# Patient Record
Sex: Male | Born: 1985 | Race: Black or African American | Hispanic: No | Marital: Single | Smoking: Current every day smoker
Health system: Southern US, Community
[De-identification: ages and names within clinical notes are randomized; demographics above are authoritative.]

## PROBLEM LIST (undated history)

## (undated) DIAGNOSIS — D571 Sickle-cell disease without crisis: Secondary | ICD-10-CM

---

## 2014-03-27 DIAGNOSIS — T300 Burn of unspecified body region, unspecified degree: Secondary | ICD-10-CM

## 2014-03-27 DIAGNOSIS — S82142A Displaced bicondylar fracture of left tibia, initial encounter for closed fracture: Secondary | ICD-10-CM

## 2014-03-27 HISTORY — DX: Burn of unspecified body region, unspecified degree: T30.0

## 2014-03-27 HISTORY — DX: Displaced bicondylar fracture of left tibia, initial encounter for closed fracture: S82.142A

## 2014-03-28 DIAGNOSIS — T24201A Burn of second degree of unspecified site of right lower limb, except ankle and foot, initial encounter: Secondary | ICD-10-CM | POA: Insufficient documentation

## 2014-03-28 HISTORY — DX: Burn of second degree of unspecified site of right lower limb, except ankle and foot, initial encounter: T24.201A

## 2019-07-09 ENCOUNTER — Other Ambulatory Visit: Payer: Self-pay

## 2019-07-09 ENCOUNTER — Encounter (HOSPITAL_COMMUNITY): Payer: Self-pay

## 2019-07-09 ENCOUNTER — Emergency Department (HOSPITAL_COMMUNITY)
Admission: EM | Admit: 2019-07-09 | Discharge: 2019-07-09 | Disposition: A | Discharge status: Home / Self Care | Payer: Self-pay | Attending: Emergency Medicine | Admitting: Emergency Medicine

## 2019-07-09 DIAGNOSIS — D5740 Sickle-cell thalassemia without crisis: Secondary | ICD-10-CM | POA: Insufficient documentation

## 2019-07-09 DIAGNOSIS — M25511 Pain in right shoulder: Secondary | ICD-10-CM | POA: Insufficient documentation

## 2019-07-09 HISTORY — DX: Sickle-cell disease without crisis: D57.1

## 2019-07-09 LAB — COMPREHENSIVE METABOLIC PANEL
ALT: 32 U/L (ref 0–44)
AST: 28 U/L (ref 15–41)
Albumin: 4.5 g/dL (ref 3.5–5.0)
Alkaline Phosphatase: 70 U/L (ref 38–126)
Anion gap: 10 (ref 5–15)
BUN: 6 mg/dL (ref 6–20)
CO2: 26 mmol/L (ref 22–32)
Calcium: 9.6 mg/dL (ref 8.9–10.3)
Chloride: 102 mmol/L (ref 98–111)
Creatinine, Ser: 1.18 mg/dL (ref 0.61–1.24)
GFR calc Af Amer: >60 mL/min (ref >60)
GFR calc non Af Amer: >60 mL/min (ref >60)
Glucose, Bld: 137 mg/dL — ABNORMAL HIGH (ref 70–99)
Potassium: 3.6 mmol/L (ref 3.5–5.1)
Sodium: 138 mmol/L (ref 135–145)
Total Bilirubin: 1.3 mg/dL — ABNORMAL HIGH (ref 0.3–1.2)
Total Protein: 7.7 g/dL (ref 6.5–8.1)

## 2019-07-09 LAB — CBC WITH DIFFERENTIAL/PLATELET
Abs Immature Granulocytes: 0.07 10*3/uL (ref 0.00–0.07)
Basophils Absolute: 0 10*3/uL (ref 0.0–0.1)
Basophils Relative: 0 %
Eosinophils Absolute: 0.1 10*3/uL (ref 0.0–0.5)
Eosinophils Relative: 1 %
HCT: 35.8 % — ABNORMAL LOW (ref 39.0–52.0)
Hemoglobin: 11.8 g/dL — ABNORMAL LOW (ref 13.0–17.0)
Immature Granulocytes: 1 %
Lymphocytes Relative: 21 %
Lymphs Abs: 2.2 10*3/uL (ref 0.7–4.0)
MCH: 23.7 pg — ABNORMAL LOW (ref 26.0–34.0)
MCHC: 33 g/dL (ref 30.0–36.0)
MCV: 71.9 fL — ABNORMAL LOW (ref 80.0–100.0)
Monocytes Absolute: 0.7 10*3/uL (ref 0.1–1.0)
Monocytes Relative: 7 %
Neutro Abs: 7.6 10*3/uL (ref 1.7–7.7)
Neutrophils Relative %: 70 %
Platelets: 173 10*3/uL (ref 150–400)
RBC: 4.98 MIL/uL (ref 4.22–5.81)
RDW: 17.6 % — ABNORMAL HIGH (ref 11.5–15.5)
WBC: 10.7 10*3/uL — ABNORMAL HIGH (ref 4.0–10.5)
nRBC: 0 % (ref 0.0–0.2)

## 2019-07-09 LAB — RETICULOCYTES
Immature Retic Fract: 34.7 % — ABNORMAL HIGH (ref 2.3–15.9)
RBC.: 4.98 MIL/uL (ref 4.22–5.81)
Retic Count, Absolute: 114.5 10*3/uL (ref 19.0–186.0)
Retic Ct Pct: 2.3 % (ref 0.4–3.1)

## 2019-07-09 MED ORDER — MORPHINE SULFATE (PF) 4 MG/ML IV SOLN
4.0000 mg | Freq: Once | INTRAVENOUS | Status: AC
  Administered 2019-07-09: 06:00:00 4 mg via INTRAVENOUS
  Filled 2019-07-09: qty 1

## 2019-07-09 MED ORDER — MORPHINE SULFATE (PF) 4 MG/ML IV SOLN
6.0000 mg | Freq: Once | INTRAVENOUS | Status: AC
  Administered 2019-07-09: 6 mg via INTRAVENOUS
  Filled 2019-07-09: qty 2

## 2019-07-09 MED ORDER — KETOROLAC TROMETHAMINE 30 MG/ML IJ SOLN
30.0000 mg | Freq: Once | INTRAMUSCULAR | Status: AC
  Administered 2019-07-09: 03:00:00 30 mg via INTRAVENOUS
  Filled 2019-07-09: qty 1

## 2019-07-09 MED ORDER — DEXTROSE-NACL 5-0.45 % IV SOLN
INTRAVENOUS | Status: DC
  Administered 2019-07-09: 03:00:00 via INTRAVENOUS

## 2019-07-09 NOTE — ED Provider Notes (Signed)
[facility] DEPT Provider Note   CSN: [id number] Arrival date & time: 07/09/19  0055     History   Chief Complaint Chief Complaint  Patient presents with  . Sickle Cell Pain Crisis    HPI Scott Hansen is a 33 y.o. male.     33 year old male with a history of sickle cell anemia, beta thalassemia presents to the emergency department for evaluation of right shoulder pain.  Describes a constant, throbbing pain that started yesterday.  Pain radiates down his right arm.  He has been taking ibuprofen for pain without relief.  Feels that his pain is similar to past episodes of sickle cell crisis.  He is not actively followed by a sickle cell doctor at this time as his previous provider retired.  No history of trauma or associated fevers, extremity numbness or paresthesias, weakness.  The history is provided by the patient. No language interpreter was used.  Sickle Cell Pain Crisis   Past Medical History:  Diagnosis Date  . Sickle cell anemia (HCC)     There are no active problems to display for this patient.   ** The histories are not reviewed yet. Please review them in the "History" navigator section and refresh this [place].     Home Medications    Prior to Admission medications   Not on File    Family History No family history on file.  Social History Social History   Tobacco Use  . Smoking status: Not on file  Substance Use Topics  . Alcohol use: Not on file  . Drug use: Not on file     Allergies   Patient has no known allergies.   Review of Systems Review of Systems Ten systems reviewed and are negative for acute change, except as noted in the HPI.    Physical Exam Updated Vital Signs BP (!) 103/55   Pulse (!) 46   Temp 98.3 F (36.8 C) (Oral)   Resp 18   Ht 6\' 4"  (1.93 m)   Wt 127 kg   SpO2 98%   BMI 34.08 kg/m   Physical Exam Vitals signs and nursing note reviewed.  Constitutional:      General: He is  not in acute distress.    Appearance: He is well-developed. He is not diaphoretic.     Comments: Nontoxic appearing and in NAD  HENT:     Head: Normocephalic and atraumatic.  Eyes:     General: No scleral icterus.    Conjunctiva/sclera: Conjunctivae normal.  Neck:     Musculoskeletal: Normal range of motion.  Cardiovascular:     Rate and Rhythm: Normal rate and regular rhythm.     Pulses: Normal pulses.  Pulmonary:     Effort: Pulmonary effort is normal. No respiratory distress.     Comments: Respirations even and unlabored Musculoskeletal: Normal range of motion.     Comments: Normal ROM of the RUE  Skin:    General: Skin is warm and dry.     Coloration: Skin is not pale.     Findings: No erythema or rash.  Neurological:     General: No focal deficit present.     Mental Status: He is alert and oriented to person, place, and time.     Coordination: Coordination normal.     Comments: Patient moving all extremities spontaneously.  Psychiatric:        Behavior: Behavior normal.      ED Treatments / Results  Labs (all  labs ordered are listed, but only abnormal results are displayed) Labs Reviewed  CBC WITH DIFFERENTIAL/PLATELET - Abnormal; Notable for the following components:      Result Value   WBC 10.7 (*)    Hemoglobin 11.8 (*)    HCT 35.8 (*)    MCV 71.9 (*)    MCH 23.7 (*)    RDW 17.6 (*)    All other components within normal limits  COMPREHENSIVE METABOLIC PANEL - Abnormal; Notable for the following components:   Glucose, Bld 137 (*)    Total Bilirubin 1.3 (*)    All other components within normal limits  RETICULOCYTES - Abnormal; Notable for the following components:   Immature Retic Fract 34.7 (*)    All other components within normal limits    EKG None  Radiology No results found.  Procedures Procedures (including critical care time)  Medications Ordered in ED Medications  dextrose 5 %-0.45 % sodium chloride infusion ( Intravenous New Bag/Given  07/09/19 0235)  ketorolac (TORADOL) 30 MG/ML injection 30 mg (30 mg Intravenous Given 07/09/19 0230)  morphine 4 MG/ML injection 6 mg (6 mg Intravenous Given 07/09/19 0224)  morphine 4 MG/ML injection 6 mg (6 mg Intravenous Given 07/09/19 0402)  morphine 4 MG/ML injection 4 mg (4 mg Intravenous Given 07/09/19 0530)    3:40 AM Patient states pain is improving. Rated at "12/10" on arrival and now down to "10/10". No longer hypertensive to suggest improvement in pain. Labs pending.   Initial Impression / Assessment and Plan / ED Course  I have reviewed the triage vital signs and the nursing notes.  Pertinent labs & imaging results that were available during my care of the patient were reviewed by me and considered in my medical decision making (see chart for details).        33 year old male with a history of sickle thalassemia presents to the emergency department for evaluation of right shoulder pain.  He reports that pain is consistent with his past sickle cell pain and is throbbing, constant since yesterday.  Patient neurovascularly intact with full range of motion of his extremity.  Pain is improved with morphine IV x3.  Labs generally reassuring.  Patient feels comfortable with discharge and outpatient pain control.  I have advised that he follow-up with the sickle cell center to establish care as a patient as he is not currently followed by a primary care doctor.  Return precautions discussed and provided. Patient discharged in stable condition with no unaddressed concerns.   Final Clinical Impressions(s) / ED Diagnoses   Final diagnoses:  Sickle thalassemia disease (HCC)  Acute pain of right shoulder    ED Discharge Orders    None       Antony Madura, PA-C 07/09/19 0554    Palumbo, April, MD 07/09/19 [phone]

## 2019-07-09 NOTE — Discharge Instructions (Signed)
Continue Tylenol or ibuprofen for management of pain.  Follow-up with the sickle cell center to establish care with a sickle cell doctor.  You may return for any new or concerning symptoms.

## 2019-07-09 NOTE — ED Triage Notes (Signed)
Arrived with complaints of sickle cell pain in his right arm that started yesterday. Denies any chest pain or shortness of breath.

## 2019-08-25 ENCOUNTER — Emergency Department (HOSPITAL_COMMUNITY): Payer: No Typology Code available for payment source

## 2019-08-25 ENCOUNTER — Other Ambulatory Visit: Payer: Self-pay

## 2019-08-25 ENCOUNTER — Inpatient Hospital Stay (HOSPITAL_COMMUNITY)
Admission: EM | Admit: 2019-08-25 | Discharge: 2019-08-28 | DRG: 517 | Payer: No Typology Code available for payment source | Attending: Student | Admitting: Student

## 2019-08-25 ENCOUNTER — Encounter (HOSPITAL_COMMUNITY): Payer: Self-pay | Admitting: Emergency Medicine

## 2019-08-25 DIAGNOSIS — S32442A Displaced fracture of posterior column [ilioischial] of left acetabulum, initial encounter for closed fracture: Secondary | ICD-10-CM | POA: Diagnosis present

## 2019-08-25 DIAGNOSIS — S32422A Displaced fracture of posterior wall of left acetabulum, initial encounter for closed fracture: Secondary | ICD-10-CM

## 2019-08-25 DIAGNOSIS — T148XXA Other injury of unspecified body region, initial encounter: Secondary | ICD-10-CM

## 2019-08-25 DIAGNOSIS — Z419 Encounter for procedure for purposes other than remedying health state, unspecified: Secondary | ICD-10-CM

## 2019-08-25 DIAGNOSIS — R52 Pain, unspecified: Secondary | ICD-10-CM

## 2019-08-25 DIAGNOSIS — Y92410 Unspecified street and highway as the place of occurrence of the external cause: Secondary | ICD-10-CM

## 2019-08-25 DIAGNOSIS — Y901 Blood alcohol level of 20-39 mg/100 ml: Secondary | ICD-10-CM | POA: Diagnosis present

## 2019-08-25 DIAGNOSIS — D571 Sickle-cell disease without crisis: Secondary | ICD-10-CM | POA: Diagnosis present

## 2019-08-25 DIAGNOSIS — Z20828 Contact with and (suspected) exposure to other viral communicable diseases: Secondary | ICD-10-CM | POA: Diagnosis present

## 2019-08-25 DIAGNOSIS — E559 Vitamin D deficiency, unspecified: Secondary | ICD-10-CM | POA: Diagnosis present

## 2019-08-25 DIAGNOSIS — S32402A Unspecified fracture of left acetabulum, initial encounter for closed fracture: Secondary | ICD-10-CM

## 2019-08-25 DIAGNOSIS — S42141A Displaced fracture of glenoid cavity of scapula, right shoulder, initial encounter for closed fracture: Secondary | ICD-10-CM | POA: Diagnosis present

## 2019-08-25 HISTORY — DX: Displaced fracture of posterior wall of left acetabulum, initial encounter for closed fracture: S32.422A

## 2019-08-25 LAB — CBC
HCT: 33.8 % — ABNORMAL LOW (ref 39.0–52.0)
HCT: 34 % — ABNORMAL LOW (ref 39.0–52.0)
Hemoglobin: 10.9 g/dL — ABNORMAL LOW (ref 13.0–17.0)
Hemoglobin: 11.2 g/dL — ABNORMAL LOW (ref 13.0–17.0)
MCH: 23.5 pg — ABNORMAL LOW (ref 26.0–34.0)
MCH: 23.8 pg — ABNORMAL LOW (ref 26.0–34.0)
MCHC: 32.2 g/dL (ref 30.0–36.0)
MCHC: 32.9 g/dL (ref 30.0–36.0)
MCV: 73 fL — ABNORMAL LOW (ref 80.0–100.0)
MCV: 72.3 fL — ABNORMAL LOW (ref 80.0–100.0)
Platelets: 150 10*3/uL (ref 150–400)
Platelets: 149 10*3/uL — ABNORMAL LOW (ref 150–400)
RBC: 4.63 MIL/uL (ref 4.22–5.81)
RBC: 4.7 MIL/uL (ref 4.22–5.81)
RDW: 16.5 % — ABNORMAL HIGH (ref 11.5–15.5)
RDW: 16.5 % — ABNORMAL HIGH (ref 11.5–15.5)
WBC: 7.1 10*3/uL (ref 4.0–10.5)
WBC: 10.4 10*3/uL (ref 4.0–10.5)
nRBC: 0 % (ref 0.0–0.2)
nRBC: 0 % (ref 0.0–0.2)

## 2019-08-25 LAB — COMPREHENSIVE METABOLIC PANEL
ALT: 26 U/L (ref 0–44)
AST: 24 U/L (ref 15–41)
Albumin: 4.1 g/dL (ref 3.5–5.0)
Alkaline Phosphatase: 50 U/L (ref 38–126)
Anion gap: 12 (ref 5–15)
BUN: 8 mg/dL (ref 6–20)
CO2: 21 mmol/L — ABNORMAL LOW (ref 22–32)
Calcium: 9.5 mg/dL (ref 8.9–10.3)
Chloride: 105 mmol/L (ref 98–111)
Creatinine, Ser: 1.36 mg/dL — ABNORMAL HIGH (ref 0.61–1.24)
GFR calc Af Amer: >60 mL/min (ref >60)
GFR calc non Af Amer: >60 mL/min (ref >60)
Glucose, Bld: 119 mg/dL — ABNORMAL HIGH (ref 70–99)
Potassium: 3.7 mmol/L (ref 3.5–5.1)
Sodium: 138 mmol/L (ref 135–145)
Total Bilirubin: 0.7 mg/dL (ref 0.3–1.2)
Total Protein: 7.1 g/dL (ref 6.5–8.1)

## 2019-08-25 LAB — SARS CORONAVIRUS 2 (TAT 6-24 HRS): SARS Coronavirus 2: NEGATIVE

## 2019-08-25 LAB — CREATININE, SERUM
Creatinine, Ser: 1.25 mg/dL — ABNORMAL HIGH (ref 0.61–1.24)
GFR calc Af Amer: >60 mL/min (ref >60)
GFR calc non Af Amer: >60 mL/min (ref >60)

## 2019-08-25 LAB — PROTIME-INR
INR: 1.1 (ref 0.8–1.2)
Prothrombin Time: 14.1 seconds (ref 11.4–15.2)

## 2019-08-25 LAB — SURGICAL PCR SCREEN
MRSA, PCR: NEGATIVE
Staphylococcus aureus: NEGATIVE

## 2019-08-25 LAB — TYPE AND SCREEN
ABO/RH(D): O POS
Antibody Screen: NEGATIVE

## 2019-08-25 LAB — ABO/RH: ABO/RH(D): O POS

## 2019-08-25 LAB — HIV ANTIBODY (ROUTINE TESTING W REFLEX): HIV Screen 4th Generation wRfx: NONREACTIVE

## 2019-08-25 LAB — ETHANOL: Alcohol, Ethyl (B): 21 mg/dL — ABNORMAL HIGH (ref <10)

## 2019-08-25 MED ORDER — METHOCARBAMOL 1000 MG/10ML IJ SOLN
500.0000 mg | Freq: Four times a day (QID) | INTRAVENOUS | Status: DC | PRN
  Filled 2019-08-25: qty 5

## 2019-08-25 MED ORDER — SODIUM CHLORIDE 0.9 % IV BOLUS (SEPSIS)
1000.0000 mL | Freq: Once | INTRAVENOUS | Status: AC
  Administered 2019-08-25: 1000 mL via INTRAVENOUS

## 2019-08-25 MED ORDER — IOHEXOL 300 MG/ML  SOLN
100.0000 mL | Freq: Once | INTRAMUSCULAR | Status: AC | PRN
  Administered 2019-08-25: 100 mL via INTRAVENOUS

## 2019-08-25 MED ORDER — TRAMADOL HCL 50 MG PO TABS
50.0000 mg | ORAL_TABLET | Freq: Four times a day (QID) | ORAL | Status: DC
  Administered 2019-08-25 – 2019-08-26 (×5): 50 mg via ORAL
  Filled 2019-08-25 (×5): qty 1

## 2019-08-25 MED ORDER — OXYCODONE HCL 5 MG PO TABS
5.0000 mg | ORAL_TABLET | ORAL | Status: DC | PRN
  Administered 2019-08-25 – 2019-08-26 (×4): 10 mg via ORAL
  Administered 2019-08-27: 5 mg via ORAL
  Administered 2019-08-28: 10 mg via ORAL
  Administered 2019-08-28: 5 mg via ORAL
  Filled 2019-08-25 (×2): qty 2
  Filled 2019-08-25: qty 1
  Filled 2019-08-25 (×3): qty 2
  Filled 2019-08-25: qty 1

## 2019-08-25 MED ORDER — TETANUS-DIPHTH-ACELL PERTUSSIS 5-2.5-18.5 LF-MCG/0.5 IM SUSP
0.5000 mL | Freq: Once | INTRAMUSCULAR | Status: AC
  Administered 2019-08-25: 0.5 mL via INTRAMUSCULAR
  Filled 2019-08-25: qty 0.5

## 2019-08-25 MED ORDER — ONDANSETRON HCL 4 MG/2ML IJ SOLN
4.0000 mg | Freq: Once | INTRAMUSCULAR | Status: AC
  Administered 2019-08-25: 05:00:00 4 mg via INTRAVENOUS
  Filled 2019-08-25: qty 2

## 2019-08-25 MED ORDER — SODIUM CHLORIDE 0.9 % IV SOLN
INTRAVENOUS | Status: DC
  Administered 2019-08-25 – 2019-08-28 (×8): via INTRAVENOUS

## 2019-08-25 MED ORDER — HEPARIN SODIUM (PORCINE) 5000 UNIT/ML IJ SOLN
5000.0000 [IU] | Freq: Three times a day (TID) | INTRAMUSCULAR | Status: DC
  Administered 2019-08-25 (×2): 5000 [IU] via SUBCUTANEOUS
  Filled 2019-08-25 (×2): qty 1

## 2019-08-25 MED ORDER — HYDROMORPHONE HCL 1 MG/ML IJ SOLN
0.5000 mg | INTRAMUSCULAR | Status: DC | PRN
  Administered 2019-08-25 – 2019-08-26 (×3): 0.5 mg via INTRAVENOUS
  Filled 2019-08-25 (×3): qty 1

## 2019-08-25 MED ORDER — METHOCARBAMOL 500 MG PO TABS
500.0000 mg | ORAL_TABLET | Freq: Four times a day (QID) | ORAL | Status: DC | PRN
  Administered 2019-08-25 – 2019-08-26 (×2): 500 mg via ORAL
  Filled 2019-08-25 (×3): qty 1

## 2019-08-25 MED ORDER — LACTATED RINGERS IV SOLN
INTRAVENOUS | Status: AC
  Administered 2019-08-25: 11:00:00 via INTRAVENOUS

## 2019-08-25 MED ORDER — HYDROMORPHONE HCL 1 MG/ML IJ SOLN
0.5000 mg | Freq: Once | INTRAMUSCULAR | Status: AC
  Administered 2019-08-25: 0.5 mg via INTRAVENOUS
  Filled 2019-08-25: qty 1

## 2019-08-25 MED ORDER — FENTANYL CITRATE (PF) 100 MCG/2ML IJ SOLN
50.0000 ug | Freq: Once | INTRAMUSCULAR | Status: AC
  Administered 2019-08-25: 50 ug via INTRAVENOUS
  Filled 2019-08-25: qty 2

## 2019-08-25 MED ORDER — ACETAMINOPHEN 325 MG PO TABS: 325.0000 mg | ORAL_TABLET | Freq: Four times a day (QID) | ORAL | Status: DC | PRN
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
## 2019-08-25 NOTE — Plan of Care (Signed)
°  Problem: Education: °Goal: Knowledge of General Education information will improve °Description: Including pain rating scale, medication(s)/side effects and non-pharmacologic comfort measures °Outcome: Progressing °  °Problem: Pain Managment: °Goal: General experience of comfort will improve °Outcome: Progressing °  °Problem: Safety: °Goal: Ability to remain free from injury will improve °Outcome: Progressing °  °Problem: Activity: °Goal: Risk for activity intolerance will decrease °Outcome: Progressing °  °
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
## 2019-08-25 NOTE — ED Notes (Signed)
Gave pt apple juice, per [facility] - RN.

## 2019-08-25 NOTE — ED Notes (Signed)
Pt to radiology via stretcher.  
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
## 2019-08-25 NOTE — Progress Notes (Signed)
Orthopedic Tech Progress Note Patient Details:  Scott Hansen 26-Apr-1986 [id number] Patient was in a lot of discomfort but I took my time and got it on Ortho Devices Type of Ortho Device: Shoulder immobilizer, Knee Immobilizer Ortho Device/Splint Location: RUE, LLE Ortho Device/Splint Interventions: Application, Adjustment, Ordered   Post Interventions Patient Tolerated: Well, Fair Instructions Provided: Care of device, Adjustment of device   [name] 08/25/2019, 11:18 AM
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
## 2019-08-26 ENCOUNTER — Encounter (HOSPITAL_COMMUNITY): Payer: Self-pay

## 2019-08-26 ENCOUNTER — Inpatient Hospital Stay (HOSPITAL_COMMUNITY): Payer: No Typology Code available for payment source

## 2019-08-26 ENCOUNTER — Inpatient Hospital Stay (HOSPITAL_COMMUNITY): Payer: No Typology Code available for payment source | Admitting: Certified Registered Nurse Anesthetist

## 2019-08-26 ENCOUNTER — Encounter (HOSPITAL_COMMUNITY): Admission: EM | Payer: Self-pay | Source: Home / Self Care | Attending: Student

## 2019-08-26 HISTORY — PX: OPEN REDUCTION INTERNAL FIXATION ACETABULUM POSTERIOR LATERAL: SHX6834

## 2019-08-26 SURGERY — OPEN REDUCTION INTERNAL FIXATION ACETABULUM POSTERIOR LATERAL
Anesthesia: General | Laterality: Left

## 2019-08-26 MED ORDER — FENTANYL CITRATE (PF) 250 MCG/5ML IJ SOLN
INTRAMUSCULAR | Status: AC
  Filled 2019-08-26: qty 5

## 2019-08-26 MED ORDER — ONDANSETRON HCL 4 MG/2ML IJ SOLN
INTRAMUSCULAR | Status: DC | PRN
  Administered 2019-08-26 (×2): 4 mg via INTRAVENOUS

## 2019-08-26 MED ORDER — DEXAMETHASONE SODIUM PHOSPHATE 10 MG/ML IJ SOLN
INTRAMUSCULAR | Status: AC
  Filled 2019-08-26: qty 1

## 2019-08-26 MED ORDER — MIDAZOLAM HCL 2 MG/2ML IJ SOLN
INTRAMUSCULAR | Status: AC
  Filled 2019-08-26: qty 2

## 2019-08-26 MED ORDER — FENTANYL CITRATE (PF) 250 MCG/5ML IJ SOLN
INTRAMUSCULAR | Status: DC | PRN
  Administered 2019-08-26: 100 ug via INTRAVENOUS
  Administered 2019-08-26: 50 ug via INTRAVENOUS
  Administered 2019-08-26: 100 ug via INTRAVENOUS

## 2019-08-26 MED ORDER — ROCURONIUM BROMIDE 10 MG/ML (PF) SYRINGE
PREFILLED_SYRINGE | INTRAVENOUS | Status: AC
  Filled 2019-08-26: qty 10

## 2019-08-26 MED ORDER — LIDOCAINE 2% (20 MG/ML) 5 ML SYRINGE
INTRAMUSCULAR | Status: DC | PRN
  Administered 2019-08-26: 100 mg via INTRAVENOUS

## 2019-08-26 MED ORDER — GABAPENTIN 100 MG PO CAPS
100.0000 mg | ORAL_CAPSULE | Freq: Three times a day (TID) | ORAL | Status: DC
  Administered 2019-08-26 – 2019-08-28 (×5): 100 mg via ORAL
  Filled 2019-08-26 (×5): qty 1

## 2019-08-26 MED ORDER — VANCOMYCIN HCL 1000 MG IV SOLR
INTRAVENOUS | Status: AC
  Filled 2019-08-26: qty 1000

## 2019-08-26 MED ORDER — KETOROLAC TROMETHAMINE 15 MG/ML IJ SOLN
15.0000 mg | Freq: Four times a day (QID) | INTRAMUSCULAR | Status: AC
  Administered 2019-08-26 – 2019-08-27 (×5): 15 mg via INTRAVENOUS
  Filled 2019-08-26 (×5): qty 1

## 2019-08-26 MED ORDER — PROPOFOL 10 MG/ML IV BOLUS
INTRAVENOUS | Status: AC
  Filled 2019-08-26: qty 20

## 2019-08-26 MED ORDER — FENTANYL CITRATE (PF) 100 MCG/2ML IJ SOLN
INTRAMUSCULAR | Status: AC
  Administered 2019-08-26: 25 ug via INTRAVENOUS
  Filled 2019-08-26: qty 2

## 2019-08-26 MED ORDER — ENOXAPARIN SODIUM 40 MG/0.4ML ~~LOC~~ SOLN
40.0000 mg | SUBCUTANEOUS | Status: DC
  Administered 2019-08-27 – 2019-08-28 (×2): 40 mg via SUBCUTANEOUS
  Filled 2019-08-26 (×2): qty 0.4

## 2019-08-26 MED ORDER — PROPOFOL 10 MG/ML IV BOLUS
INTRAVENOUS | Status: DC | PRN
  Administered 2019-08-26: 200 mg via INTRAVENOUS

## 2019-08-26 MED ORDER — MIDAZOLAM HCL 2 MG/2ML IJ SOLN
INTRAMUSCULAR | Status: DC | PRN
  Administered 2019-08-26: 2 mg via INTRAVENOUS

## 2019-08-26 MED ORDER — ACETAMINOPHEN 500 MG PO TABS: 1000.0000 mg | ORAL_TABLET | Freq: Once | ORAL | Status: DC

## 2019-08-26 MED ORDER — CEFAZOLIN SODIUM-DEXTROSE 2-4 GM/100ML-% IV SOLN
INTRAVENOUS | Status: AC
  Filled 2019-08-26: qty 100

## 2019-08-26 MED ORDER — DEXAMETHASONE SODIUM PHOSPHATE 10 MG/ML IJ SOLN
INTRAMUSCULAR | Status: DC | PRN
  Administered 2019-08-26: 10 mg via INTRAVENOUS

## 2019-08-26 MED ORDER — LACTATED RINGERS IV SOLN
INTRAVENOUS | Status: DC
  Administered 2019-08-26: 13:00:00 via INTRAVENOUS

## 2019-08-26 MED ORDER — SUGAMMADEX SODIUM 200 MG/2ML IV SOLN
INTRAVENOUS | Status: DC | PRN
  Administered 2019-08-26: 250 mg via INTRAVENOUS

## 2019-08-26 MED ORDER — ROCURONIUM BROMIDE 10 MG/ML (PF) SYRINGE
PREFILLED_SYRINGE | INTRAVENOUS | Status: DC | PRN
  Administered 2019-08-26: 100 mg via INTRAVENOUS
  Administered 2019-08-26: 40 mg via INTRAVENOUS

## 2019-08-26 MED ORDER — DEXMEDETOMIDINE HCL IN NACL 200 MCG/50ML IV SOLN
INTRAVENOUS | Status: DC | PRN
  Administered 2019-08-26: 8 ug via INTRAVENOUS
  Administered 2019-08-26: 12 ug via INTRAVENOUS
  Administered 2019-08-26 (×3): 8 ug via INTRAVENOUS

## 2019-08-26 MED ORDER — CEFAZOLIN SODIUM-DEXTROSE 2-4 GM/100ML-% IV SOLN
2.0000 g | Freq: Three times a day (TID) | INTRAVENOUS | Status: AC
  Administered 2019-08-26 – 2019-08-27 (×3): 2 g via INTRAVENOUS
  Filled 2019-08-26 (×3): qty 100

## 2019-08-26 MED ORDER — CEFAZOLIN SODIUM-DEXTROSE 2-3 GM-%(50ML) IV SOLR
INTRAVENOUS | Status: DC | PRN
  Administered 2019-08-26: 2 g via INTRAVENOUS

## 2019-08-26 MED ORDER — FENTANYL CITRATE (PF) 100 MCG/2ML IJ SOLN
25.0000 ug | INTRAMUSCULAR | Status: DC | PRN
  Administered 2019-08-26: 18:00:00 25 ug via INTRAVENOUS

## 2019-08-26 MED ORDER — OXYCODONE HCL 5 MG PO TABS: 10.0000 mg | ORAL_TABLET | ORAL | Status: DC | PRN

## 2019-08-26 MED ORDER — HYDROMORPHONE HCL 1 MG/ML IJ SOLN: 1.0000 mg | INTRAMUSCULAR | Status: DC | PRN

## 2019-08-26 MED ORDER — ACETAMINOPHEN 500 MG PO TABS
1000.0000 mg | ORAL_TABLET | Freq: Four times a day (QID) | ORAL | Status: DC
  Administered 2019-08-26 – 2019-08-28 (×7): 1000 mg via ORAL
  Filled 2019-08-26 (×7): qty 2

## 2019-08-26 MED ORDER — TOBRAMYCIN SULFATE 1.2 G IJ SOLR
INTRAMUSCULAR | Status: AC
  Filled 2019-08-26: qty 1.2

## 2019-08-26 SURGICAL SUPPLY — 66 items
BIT DRILL 2.5X300 (BIT) IMPLANT
BIT DRILL STEP 3.5 (DRILL) IMPLANT
BLADE CLIPPER SURG (BLADE) IMPLANT
BRUSH SCRUB EZ PLAIN DRY (MISCELLANEOUS) ×6 IMPLANT
CHLORAPREP W/TINT 26 (MISCELLANEOUS) ×5 IMPLANT
COVER SURGICAL LIGHT HANDLE (MISCELLANEOUS) ×3 IMPLANT
COVER WAND RF STERILE (DRAPES) ×3 IMPLANT
DRAPE C-ARM 42X72 X-RAY (DRAPES) ×3 IMPLANT
DRAPE C-ARMOR (DRAPES) ×3 IMPLANT
DRAPE INCISE IOBAN 66X45 STRL (DRAPES) ×3 IMPLANT
DRAPE INCISE IOBAN 85X60 (DRAPES) ×3 IMPLANT
DRAPE ORTHO SPLIT 77X108 STRL (DRAPES) ×4
DRAPE SURG ORHT 6 SPLT 77X108 (DRAPES) ×2 IMPLANT
DRAPE U-SHAPE 47X51 STRL (DRAPES) ×3 IMPLANT
DRILL BIT 2.5X300 (BIT) ×2
DRILL STEP 3.5 (DRILL)
DRSG MEPILEX BORDER 4X12 (GAUZE/BANDAGES/DRESSINGS) IMPLANT
DRSG MEPILEX BORDER 4X8 (GAUZE/BANDAGES/DRESSINGS) IMPLANT
DRSG MEPILEX SACRM 8.7X9.8 (GAUZE/BANDAGES/DRESSINGS) ×2 IMPLANT
ELECT BLADE 6.5 EXT (BLADE) ×3 IMPLANT
ELECT REM PT RETURN 9FT ADLT (ELECTROSURGICAL) ×3
ELECTRODE REM PT RTRN 9FT ADLT (ELECTROSURGICAL) ×1 IMPLANT
GLOVE BIO SURGEON STRL SZ 6.5 (GLOVE) ×6 IMPLANT
GLOVE BIO SURGEON STRL SZ7.5 (GLOVE) ×12 IMPLANT
GLOVE BIO SURGEONS STRL SZ 6.5 (GLOVE) ×3
GLOVE BIOGEL PI IND STRL 6.5 (GLOVE) ×1 IMPLANT
GLOVE BIOGEL PI IND STRL 7.5 (GLOVE) ×1 IMPLANT
GLOVE BIOGEL PI INDICATOR 6.5 (GLOVE) ×2
GLOVE BIOGEL PI INDICATOR 7.5 (GLOVE) ×2
GOWN STRL REUS W/ TWL LRG LVL3 (GOWN DISPOSABLE) ×2 IMPLANT
GOWN STRL REUS W/TWL LRG LVL3 (GOWN DISPOSABLE) ×4
HANDPIECE INTERPULSE COAX TIP (DISPOSABLE) ×2
KIT BASIN OR (CUSTOM PROCEDURE TRAY) ×3 IMPLANT
KIT TURNOVER KIT B (KITS) ×3 IMPLANT
MANIFOLD NEPTUNE II (INSTRUMENTS) ×3 IMPLANT
NS IRRIG 1000ML POUR BTL (IV SOLUTION) ×3 IMPLANT
PACK TOTAL JOINT (CUSTOM PROCEDURE TRAY) ×3 IMPLANT
PAD ARMBOARD 7.5X6 YLW CONV (MISCELLANEOUS) ×6 IMPLANT
PLATE BONE 91MM 7HOLE PELVIC (Plate) ×2 IMPLANT
RETRIEVER SUT HEWSON (MISCELLANEOUS) ×3 IMPLANT
SCREW CORTEX 3.5 38MM (Screw) ×2 IMPLANT
SCREW CORTEX 3.5 45MM (Screw) ×2 IMPLANT
SCREW CORTEX 3.5X40MM (Screw) ×4 IMPLANT
SCREW LOCK CORT ST 3.5X38 (Screw) IMPLANT
SET HNDPC FAN SPRY TIP SCT (DISPOSABLE) ×1 IMPLANT
SPONGE LAP 18X18 RF (DISPOSABLE) IMPLANT
STAPLER VISISTAT 35W (STAPLE) ×3 IMPLANT
SUCTION FRAZIER HANDLE 10FR (MISCELLANEOUS) ×2
SUCTION TUBE FRAZIER 10FR DISP (MISCELLANEOUS) ×1 IMPLANT
SUT ETHILON 2 0 PSLX (SUTURE) ×6 IMPLANT
SUT FIBERWIRE #2 38 T-5 BLUE (SUTURE) ×6
SUT MNCRL AB 3-0 PS2 18 (SUTURE) ×3 IMPLANT
SUT MON AB 2-0 CT1 36 (SUTURE) ×3 IMPLANT
SUT VIC AB 0 CT1 27 (SUTURE) ×2
SUT VIC AB 0 CT1 27XBRD ANBCTR (SUTURE) ×1 IMPLANT
SUT VIC AB 1 CT1 18XCR BRD 8 (SUTURE) ×1 IMPLANT
SUT VIC AB 1 CT1 27 (SUTURE) ×2
SUT VIC AB 1 CT1 27XBRD ANBCTR (SUTURE) ×1 IMPLANT
SUT VIC AB 1 CT1 8-18 (SUTURE) ×4
SUT VIC AB 2-0 CT1 27 (SUTURE) ×4
SUT VIC AB 2-0 CT1 TAPERPNT 27 (SUTURE) ×1 IMPLANT
SUTURE FIBERWR #2 38 T-5 BLUE (SUTURE) ×2 IMPLANT
TOWEL GREEN STERILE (TOWEL DISPOSABLE) ×6 IMPLANT
TOWEL GREEN STERILE FF (TOWEL DISPOSABLE) ×6 IMPLANT
TRAY FOLEY MTR SLVR 16FR STAT (SET/KITS/TRAYS/PACK) IMPLANT
WATER STERILE IRR 1000ML POUR (IV SOLUTION) IMPLANT

## 2019-08-26 NOTE — Plan of Care (Signed)
  Problem: Education: Goal: Knowledge of General Education information will improve Description: Including pain rating scale, medication(s)/side effects and non-pharmacologic comfort measures Outcome: Progressing   Problem: Pain Managment: Goal: General experience of comfort will improve Outcome: Progressing   Problem: Safety: Goal: Ability to remain free from injury will improve Outcome: Progressing   Problem: Skin Integrity: Goal: Risk for impaired skin integrity will decrease Outcome: Progressing   Problem: Activity: Goal: Risk for activity intolerance will decrease Outcome: Progressing   
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
## 2019-08-26 NOTE — Anesthesia Procedure Notes (Signed)
Procedure Name: Intubation Date/Time: 08/26/2019 2:43 PM Performed by: Kathryne Hitch, CRNA Pre-anesthesia Checklist: Patient identified, Emergency Drugs available, Suction available and Patient being monitored Patient Re-evaluated:Patient Re-evaluated prior to induction Oxygen Delivery Method: Circle system utilized Preoxygenation: Pre-oxygenation with 100% oxygen Induction Type: IV induction Ventilation: Mask ventilation without difficulty Laryngoscope Size: Mac and 4 Grade View: Grade I Tube type: Oral Tube size: 7.5 mm Number of attempts: 1 Airway Equipment and Method: Stylet and Oral airway Placement Confirmation: ETT inserted through vocal cords under direct vision,  positive ETCO2 and breath sounds checked- equal and bilateral Secured at: 23 cm Tube secured with: Tape Dental Injury: Teeth and Oropharynx as per pre-operative assessment
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
## 2019-08-26 NOTE — Plan of Care (Signed)
  Problem: Clinical Measurements: Goal: Ability to maintain clinical measurements within normal limits will improve Outcome: Progressing   Problem: Pain Managment: Goal: General experience of comfort will improve Outcome: Progressing   Problem: Safety: Goal: Ability to remain free from injury will improve Outcome: Progressing   Problem: Skin Integrity: Goal: Risk for impaired skin integrity will decrease Outcome: Progressing   Problem: Education: Goal: Knowledge of General Education information will improve Description: Including pain rating scale, medication(s)/side effects and non-pharmacologic comfort measures Outcome: Progressing   Problem: Clinical Measurements: Goal: Ability to maintain clinical measurements within normal limits will improve Outcome: Progressing   Problem: Activity: Goal: Risk for activity intolerance will decrease Outcome: Progressing   Problem: Nutrition: Goal: Adequate nutrition will be maintained Outcome: Progressing   Problem: Coping: Goal: Level of anxiety will decrease Outcome: Progressing   Problem: Elimination: Goal: Will not experience complications related to bowel motility Outcome: Progressing   Problem: Pain Managment: Goal: General experience of comfort will improve Outcome: Progressing   Problem: Safety: Goal: Ability to remain free from injury will improve Outcome: Progressing   Problem: Skin Integrity: Goal: Risk for impaired skin integrity will decrease Outcome: Progressing

## 2019-08-26 NOTE — Transfer of Care (Signed)
Immediate Anesthesia Transfer of Care Note  Patient: Matheu Royce  Procedure(s) Performed: OPEN REDUCTION INTERNAL FIXATION ACETABULUM POSTERIOR LATERAL (Left )  Patient Location: PACU  Anesthesia Type:General  Level of Consciousness: drowsy, patient cooperative and responds to stimulation  Airway & Oxygen Therapy: Patient Spontanous Breathing and Patient connected to face mask oxygen  Post-op Assessment: Report given to RN and Post -op Vital signs reviewed and stable  Post vital signs: Reviewed and stable  Last Vitals:  Vitals Value Taken Time  BP 134/81 08/26/19 1745  Temp 36.7 C 08/26/19 1745  Pulse 87 08/26/19 1750  Resp 28 08/26/19 1750  SpO2 100 % 08/26/19 1750  Vitals shown include unvalidated device data.  Last Pain:  Vitals:   08/26/19 1745  TempSrc:   PainSc: Asleep      Patients Stated Pain Goal: 3 (07/37/10 6269)  Complications: No apparent anesthesia complications

## 2019-08-27 ENCOUNTER — Encounter (HOSPITAL_COMMUNITY): Payer: Self-pay | Admitting: Student

## 2019-08-27 DIAGNOSIS — S42151A Displaced fracture of neck of scapula, right shoulder, initial encounter for closed fracture: Secondary | ICD-10-CM

## 2019-08-27 DIAGNOSIS — S42141A Displaced fracture of glenoid cavity of scapula, right shoulder, initial encounter for closed fracture: Secondary | ICD-10-CM

## 2019-08-27 HISTORY — DX: Displaced fracture of glenoid cavity of scapula, right shoulder, initial encounter for closed fracture: S42.151A

## 2019-08-27 HISTORY — DX: Displaced fracture of glenoid cavity of scapula, right shoulder, initial encounter for closed fracture: S42.141A

## 2019-08-27 LAB — CBC
HCT: 30.7 % — ABNORMAL LOW (ref 39.0–52.0)
Hemoglobin: 10.4 g/dL — ABNORMAL LOW (ref 13.0–17.0)
MCH: 23.8 pg — ABNORMAL LOW (ref 26.0–34.0)
MCHC: 33.9 g/dL (ref 30.0–36.0)
MCV: 70.3 fL — ABNORMAL LOW (ref 80.0–100.0)
Platelets: 164 10*3/uL (ref 150–400)
RBC: 4.37 MIL/uL (ref 4.22–5.81)
RDW: 16.1 % — ABNORMAL HIGH (ref 11.5–15.5)
WBC: 12.5 10*3/uL — ABNORMAL HIGH (ref 4.0–10.5)
nRBC: 0 % (ref 0.0–0.2)

## 2019-08-27 LAB — VITAMIN D 25 HYDROXY (VIT D DEFICIENCY, FRACTURES): Vit D, 25-Hydroxy: 5.14 ng/mL — ABNORMAL LOW (ref 30–100)

## 2019-08-27 MED ORDER — VITAMIN D 25 MCG (1000 UNIT) PO TABS
2000.0000 [IU] | ORAL_TABLET | Freq: Two times a day (BID) | ORAL | Status: DC
  Administered 2019-08-27 – 2019-08-28 (×3): 2000 [IU] via ORAL
  Filled 2019-08-27 (×3): qty 2

## 2019-08-27 MED ORDER — VITAMIN D (ERGOCALCIFEROL) 1.25 MG (50000 UNIT) PO CAPS
50000.0000 [IU] | ORAL_CAPSULE | ORAL | Status: DC
  Administered 2019-08-27: 50000 [IU] via ORAL
  Filled 2019-08-27: qty 1

## 2019-08-27 NOTE — Plan of Care (Signed)
  Problem: Education: Goal: Knowledge of General Education information will improve Description: Including pain rating scale, medication(s)/side effects and non-pharmacologic comfort measures Outcome: Progressing   Problem: Health Behavior/Discharge Planning: Goal: Ability to manage health-related needs will improve Outcome: Progressing   Problem: Clinical Measurements: Goal: Ability to maintain clinical measurements within normal limits will improve Outcome: Progressing   Problem: Pain Managment: Goal: General experience of comfort will improve Outcome: Progressing   Problem: Safety: Goal: Ability to remain free from injury will improve Outcome: Progressing   Problem: Skin Integrity: Goal: Risk for impaired skin integrity will decrease Outcome: Progressing   Problem: Education: Goal: Knowledge of General Education information will improve Description: Including pain rating scale, medication(s)/side effects and non-pharmacologic comfort measures Outcome: Progressing   Problem: Clinical Measurements: Goal: Ability to maintain clinical measurements within normal limits will improve Outcome: Progressing   Problem: Activity: Goal: Risk for activity intolerance will decrease Outcome: Progressing   Problem: Nutrition: Goal: Adequate nutrition will be maintained Outcome: Progressing   Problem: Elimination: Goal: Will not experience complications related to bowel motility Outcome: Progressing   Problem: Pain Managment: Goal: General experience of comfort will improve Outcome: Progressing   Problem: Safety: Goal: Ability to remain free from injury will improve Outcome: Progressing   Problem: Skin Integrity: Goal: Risk for impaired skin integrity will decrease Outcome: Progressing

## 2019-08-27 NOTE — Anesthesia Postprocedure Evaluation (Signed)
Anesthesia Post Note  Patient: Banker  Procedure(s) Performed: OPEN REDUCTION INTERNAL FIXATION ACETABULUM POSTERIOR LATERAL (Left )     Patient location during evaluation: PACU Anesthesia Type: General Level of consciousness: sedated and patient cooperative Pain management: pain level controlled Vital Signs Assessment: post-procedure vital signs reviewed and stable Respiratory status: spontaneous breathing Cardiovascular status: stable Anesthetic complications: no    Last Vitals:  Vitals:   08/27/19 0408 08/27/19 0749  BP: 134/82 132/75  Pulse: (!) 53 69  Resp: 17 18  Temp: 36.8 C 36.5 C  SpO2: 100% 100%    Last Pain:  Vitals:   08/27/19 0749  TempSrc: Oral  PainSc:                  Nolon Nations
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
## 2019-08-28 ENCOUNTER — Encounter (HOSPITAL_COMMUNITY): Payer: Self-pay

## 2019-08-28 ENCOUNTER — Inpatient Hospital Stay (HOSPITAL_COMMUNITY): Payer: No Typology Code available for payment source

## 2019-08-28 MED ORDER — IOHEXOL 180 MG/ML  SOLN
10.0000 mL | Freq: Once | INTRAMUSCULAR | Status: AC | PRN
  Administered 2019-08-28: 10 mL via INTRA_ARTICULAR

## 2019-08-28 MED ORDER — GABAPENTIN 100 MG PO CAPS: 100.0000 mg | ORAL_CAPSULE | Freq: Three times a day (TID) | ORAL | 0 refills | Status: DC

## 2019-08-28 MED ORDER — SODIUM CHLORIDE (PF) 0.9 % IJ SOLN
10.0000 mL | Freq: Once | INTRAMUSCULAR | Status: AC
  Administered 2019-08-28: 10 mL
  Filled 2019-08-28: qty 10

## 2019-08-28 MED ORDER — VITAMIN D (ERGOCALCIFEROL) 1.25 MG (50000 UNIT) PO CAPS: 50000.0000 [IU] | ORAL_CAPSULE | ORAL | 0 refills | Status: AC

## 2019-08-28 MED ORDER — VITAMIN D-3 125 MCG (5000 UT) PO TABS: 5000.0000 [IU] | ORAL_TABLET | Freq: Every day | ORAL | 1 refills | Status: AC

## 2019-08-28 MED ORDER — ENOXAPARIN SODIUM 40 MG/0.4ML ~~LOC~~ SOLN: 40.0000 mg | SUBCUTANEOUS | 0 refills | Status: DC

## 2019-08-28 MED ORDER — OXYCODONE-ACETAMINOPHEN 5-325 MG PO TABS: 1.0000 | ORAL_TABLET | ORAL | 0 refills | Status: AC | PRN

## 2019-08-28 MED ORDER — METHOCARBAMOL 750 MG PO TABS: 750.0000 mg | ORAL_TABLET | Freq: Four times a day (QID) | ORAL | 0 refills | Status: DC | PRN

## 2019-08-28 MED ORDER — GADOBENATE DIMEGLUMINE 529 MG/ML IV SOLN
0.0100 mL | Freq: Once | INTRAVENOUS | Status: AC | PRN
  Administered 2019-08-28: 0.01 mL via INTRA_ARTICULAR

## 2019-08-28 MED ORDER — LIDOCAINE HCL (PF) 1 % IJ SOLN
5.0000 mL | Freq: Once | INTRAMUSCULAR | Status: AC
  Administered 2019-08-28: 5 mL via INTRADERMAL
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
## 2019-08-28 NOTE — Plan of Care (Signed)

## 2019-08-28 NOTE — Progress Notes (Signed)
Patient discharging via [facility]AR accompanied by [employer]. Per Case management,  they wont be able to arrange DME since patient is going to jail. Discharge paperwork was given to EMTS, and also explained to patient prior to discharge with patient verbalizing understanding. No further questions to concerns voiced.
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
## 2019-08-28 NOTE — Discharge Instructions (Signed)
[facility] Discharge Instructions   General Discharge Instructions  WEIGHT BEARING STATUS: Touchdown weightbearing left lower extremity. Okay to weightbear as tolerated through right shoulder  RANGE OF MOTION/ACTIVITY: Okay for gentle range of motion of hip.  Wound Care: Incisions can be left open to air if there is no drainage. Leave steri-strips in place. If incision continues to have drainage, follow wound care instructions below. Okay to shower if no drainage from incisions.  DVT/PE prophylaxis: Lovenox x 30 days  Diet: as you were eating previously.  Can use over the counter stool softeners and bowel preparations, such as Miralax, to help with bowel movements.  Narcotics can be constipating.  Be sure to drink plenty of fluids  PAIN MEDICATION USE AND EXPECTATIONS  You have likely been given narcotic medications to help control your pain.  After a traumatic event that results in an fracture (broken bone) with or without surgery, it is ok to use narcotic pain medications to help control one's pain.  We understand that everyone responds to pain differently and each individual patient will be evaluated on a regular basis for the continued need for narcotic medications. Ideally, narcotic medication use should last no more than 6-8 weeks (coinciding with fracture healing).   As a patient it is your responsibility as well to monitor narcotic medication use and report the amount and frequency you use these medications when you come to your office visit.   We would also advise that if you are using narcotic medications, you should take a dose prior to therapy to maximize you participation.  IF YOU ARE ON NARCOTIC MEDICATIONS IT IS NOT PERMISSIBLE TO OPERATE A MOTOR VEHICLE (MOTORCYCLE/CAR/TRUCK/MOPED) OR HEAVY MACHINERY DO NOT MIX NARCOTICS WITH OTHER CNS (CENTRAL NERVOUS SYSTEM) DEPRESSANTS SUCH AS ALCOHOL   STOP SMOKING OR USING NICOTINE PRODUCTS!!!!  As discussed nicotine  severely impairs your body's ability to heal surgical and traumatic wounds but also impairs bone healing.  Wounds and bone heal by forming microscopic blood vessels (angiogenesis) and nicotine is a vasoconstrictor (essentially, shrinks blood vessels).  Therefore, if vasoconstriction occurs to these microscopic blood vessels they essentially disappear and are unable to deliver necessary nutrients to the healing tissue.  This is one modifiable factor that you can do to dramatically increase your chances of healing your injury.    (This means no smoking, no nicotine gum, patches, etc)  DO NOT USE NONSTEROIDAL ANTI-INFLAMMATORY DRUGS (NSAID'S)  Using products such as Advil (ibuprofen), Aleve (naproxen), Motrin (ibuprofen) for additional pain control during fracture healing can delay and/or prevent the healing response.  If you would like to take over the counter (OTC) medication, Tylenol (acetaminophen) is ok.  However, some narcotic medications that are given for pain control contain acetaminophen as well. Therefore, you should not exceed more than 4000 mg of tylenol in a day if you do not have liver disease.  Also note that there are may OTC medicines, such as cold medicines and allergy medicines that my contain tylenol as well.  If you have any questions about medications and/or interactions please ask your doctor/PA or your pharmacist.      ICE AND ELEVATE INJURED/OPERATIVE EXTREMITY  Using ice and elevating the injured extremity above your heart can help with swelling and pain control.  Icing in a pulsatile fashion, such as 20 minutes on and 20 minutes off, can be followed.    Do not place ice directly on skin. Make sure there is a barrier between to skin and  the ice pack.    Using frozen items such as frozen peas works well as the conform nicely to the are that needs to be iced.  USE AN ACE WRAP OR TED HOSE FOR SWELLING CONTROL  In addition to icing and elevation, Ace wraps or TED hose are used to  help limit and resolve swelling.  It is recommended to use Ace wraps or TED hose until you are informed to stop.    When using Ace Wraps start the wrapping distally (farthest away from the body) and wrap proximally (closer to the body)   Example: If you had surgery on your leg or thing and you do not have a splint on, start the ace wrap at the toes and work your way up to the thigh        If you had surgery on your upper extremity and do not have a splint on, start the ace wrap at your fingers and work your way up to the upper arm  IF YOU ARE IN A SPLINT OR CAST DO NOT [place]   If your splint gets wet for any reason please contact the office immediately. You may shower in your splint or cast as long as you keep it dry.  This can be done by wrapping in a cast cover or garbage back (or similar)  Do Not stick any thing down your splint or cast such as pencils, money, or hangers to try and scratch yourself with.  If you feel itchy take benadryl as prescribed on the bottle for itching  IF YOU ARE IN A CAM BOOT (BLACK BOOT)  You may remove boot periodically. Perform daily dressing changes as noted below.  Wash the liner of the boot regularly and wear a sock when wearing the boot. It is recommended that you sleep in the boot until told otherwise   CALL THE OFFICE WITH ANY QUESTIONS OR CONCERNS: [phone]   VISIT OUR WEBSITE FOR ADDITIONAL INFORMATION: [email or web address]    Discharge Wound Care Instructions  Do NOT apply any ointments, solutions or lotions to pin sites or surgical wounds.  These prevent needed drainage and even though solutions like hydrogen peroxide kill bacteria, they also damage cells lining the pin sites that help fight infection.  Applying lotions or ointments can keep the wounds moist and can cause them to breakdown and open up as well. This can increase the risk for infection. When in doubt call the office.  Surgical incisions should be dressed daily.  If  any drainage is noted, use one layer of adaptic, then gauze, Kerlix, and an ace wrap.  Once the incision is completely dry and without drainage, it may be left open to air out.  Showering may begin 36-48 hours later.  Cleaning gently with soap and water.  Traumatic wounds should be dressed daily as well.    One layer of adaptic, gauze, Kerlix, then ace wrap.  The adaptic can be discontinued once the draining has ceased    If you have a wet to dry dressing: wet the gauze with saline the squeeze as much saline out so the gauze is moist (not soaking wet), place moistened gauze over wound, then place a dry gauze over the moist one, followed by Kerlix wrap, then ace wrap.

## 2019-09-04 ENCOUNTER — Encounter (HOSPITAL_COMMUNITY): Payer: Self-pay | Admitting: Student

## 2019-09-25 ENCOUNTER — Ambulatory Visit: Payer: Self-pay | Admitting: Orthopedic Surgery
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
## 2020-03-25 DIAGNOSIS — Z0000 Encounter for general adult medical examination without abnormal findings: Secondary | ICD-10-CM | POA: Insufficient documentation

## 2020-03-25 DIAGNOSIS — D571 Sickle-cell disease without crisis: Secondary | ICD-10-CM | POA: Insufficient documentation

## 2020-03-25 HISTORY — DX: Encounter for general adult medical examination without abnormal findings: Z00.00

## 2020-03-25 HISTORY — DX: Sickle-cell disease without crisis: D57.1

## 2021-01-11 DIAGNOSIS — R079 Chest pain, unspecified: Secondary | ICD-10-CM

## 2021-01-11 DIAGNOSIS — R778 Other specified abnormalities of plasma proteins: Secondary | ICD-10-CM

## 2021-01-11 DIAGNOSIS — R52 Pain, unspecified: Secondary | ICD-10-CM

## 2021-01-11 DIAGNOSIS — D5700 Hb-SS disease with crisis, unspecified: Secondary | ICD-10-CM

## 2021-01-11 DIAGNOSIS — R7989 Other specified abnormal findings of blood chemistry: Secondary | ICD-10-CM

## 2021-01-11 HISTORY — DX: Chest pain, unspecified: R07.9

## 2021-01-11 HISTORY — DX: Other specified abnormal findings of blood chemistry: R79.89

## 2021-01-11 HISTORY — DX: Pain, unspecified: R52

## 2021-01-11 HISTORY — DX: Other specified abnormalities of plasma proteins: R77.8

## 2021-01-11 HISTORY — DX: Hb-SS disease with crisis, unspecified: D57.00

## 2021-01-29 ENCOUNTER — Telehealth: Payer: Self-pay

## 2021-01-29 DIAGNOSIS — D571 Sickle-cell disease without crisis: Secondary | ICD-10-CM | POA: Insufficient documentation

## 2021-01-29 NOTE — Telephone Encounter (Signed)
Notes on file.  

## 2021-02-01 ENCOUNTER — Ambulatory Visit: Payer: Medicaid Other | Admitting: Cardiology

## 2021-02-01 ENCOUNTER — Telehealth: Payer: Self-pay

## 2021-02-01 NOTE — Telephone Encounter (Signed)
Notes faxed to [facility]

## 2021-02-17 ENCOUNTER — Ambulatory Visit (INDEPENDENT_AMBULATORY_CARE_PROVIDER_SITE_OTHER): Payer: Medicaid Other | Admitting: Cardiology

## 2021-02-17 ENCOUNTER — Other Ambulatory Visit: Payer: Self-pay

## 2021-02-17 ENCOUNTER — Encounter: Payer: Self-pay | Admitting: Cardiology

## 2021-02-17 VITALS — BP 120/90 | HR 88 | Ht 75.0 in | Wt 301.1 lb

## 2021-02-17 DIAGNOSIS — R0602 Shortness of breath: Secondary | ICD-10-CM

## 2021-02-17 DIAGNOSIS — Z7689 Persons encountering health services in other specified circumstances: Secondary | ICD-10-CM | POA: Diagnosis not present

## 2021-02-17 DIAGNOSIS — R9431 Abnormal electrocardiogram [ECG] [EKG]: Secondary | ICD-10-CM

## 2021-02-17 DIAGNOSIS — R079 Chest pain, unspecified: Secondary | ICD-10-CM | POA: Diagnosis not present
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
## 2021-02-17 NOTE — Patient Instructions (Signed)
Medication Instructions:  Your physician recommends that you continue on your current medications as directed. Please refer to the Current Medication list given to you today.  *If you need a refill on your cardiac medications before your next appointment, please call your pharmacy*   Lab Work: None If you have labs (blood work) drawn today and your tests are completely normal, you will receive your results only by: . MyChart Message (if you have MyChart) OR . A paper copy in the mail If you have any lab test that is abnormal or we need to change your treatment, we will call you to review the results.   Testing/Procedures: Your physician has requested that you have a stress echocardiogram. For further information please visit [email or web address]. Please follow instruction sheet as given.    Follow-Up: At [facility], you and your health needs are our priority.  As part of our continuing mission to provide you with exceptional heart care, we have created designated Provider Care Teams.  These Care Teams include your primary Cardiologist (physician) and Advanced Practice Providers (APPs -  Physician Assistants and Nurse Practitioners) who all work together to provide you with the care you need, when you need it.  We recommend signing up for the patient portal called "MyChart".  Sign up information is provided on this After Visit Summary.  MyChart is used to connect with patients for Virtual Visits (Telemedicine).  Patients are able to view lab/test results, encounter notes, upcoming appointments, etc.  Non-urgent messages can be sent to your provider as well.   To learn more about what you can do with MyChart, go to [email or web address].    Your next appointment:   3 month(s)  The format for your next appointment:   In Person  Provider:   Kardie Tobb, DO   Other Instructions     Stress Echocardiogram Information Sheet                                                       Instructions:    1. You may take your morning medications the morning of the test  2. Light breakfast no caffeine  3. Dress prepared to exercise.  4. DO NOT use ANY caffeine or tobacco products 3 hours before appointment.  5. Please bring all current prescription medications.  Exercise Stress Echocardiogram An exercise stress echocardiogram is a test to check how well your heart is working. This test uses sound waves and a computer to make pictures of your heart. These pictures will be taken before and after you exercise. For this test, you will walk on a treadmill or ride a bicycle to make your heart beat faster. While you exercise, your heart will be checked with an electrocardiogram (ECG). Your blood pressure will also be checked. You may have this test if:  You have chest pain or a heart problem.  You had a heart attack or heart surgery not long ago.  You have heart valve problems.  You have a condition that causes narrowing of the blood vessels that supply your heart.  You have a high risk of heart disease and: ? You are starting a new exercise program. ? You need to have a big surgery. Tell a doctor about:  Any allergies you have.  All medicines you are taking. This includes vitamins, herbs, eye   drops, creams, and over-the-counter medicines.  Any problems you or family members have had with medicines that make you fall asleep (anesthetic medicines).  Any surgeries you have had.  Any blood disorders you have.  Any medical conditions you have.  Whether you are pregnant or may be pregnant. What are the risks? Generally, this is a safe test. However, problems may occur, including:  Chest pain.  Feeling dizzy or light-headed.  Shortness of breath.  Increased or irregular heartbeat.  Feeling like you may vomit (nausea) or vomiting.  Heart attack. This is very rare. What happens before the test? Medicines  Ask your doctor about changing or stopping your  normal medicines. This is important if you take diabetes medicines or blood thinners.  If you use an inhaler, bring it to the test. General instructions  Wear comfortable clothes and walking shoes.  Follow instructions from your doctor about what you cannot eat or drink before the test.  Do not drink or eat anything that has caffeine in it. Stop having caffeine 24 hours before the test.  Do not smoke or use products that contain nicotine or tobacco for 4 hours before the test. If you need help quitting, ask your doctor. What happens during the test?  You will take off your clothes from the waist up and put on a hospital gown.  Electrodes or patches will be put on your chest.  A blood pressure cuff will be put on your arm.  Before you exercise, a computer will make a picture of your heart. To do this: ? You will lie down and a gel will be put on your chest. ? A wand will be moved over the gel. ? Sound waves from the wand will go to the computer to make the picture.  Then, you will start to exercise. You may walk on a treadmill or pedal a bicycle.  Your blood pressure and heart rhythm will be checked while you exercise.  The exercise will get harder or faster.  You will exercise until: ? Your heart reaches a certain level. ? You are too tired to go on. ? You cannot go on because of chest pain, weakness, or dizziness.  You will lie down right away so another picture of your heart can be taken. The procedure may vary among doctors and hospitals.   What can I expect after the test?  After your test, it is common to have: ? Mild soreness. ? Mild tiredness. Your heart rate and blood pressure will be checked until they return to your normal levels. You should not have any new symptoms after this test. Follow these instructions at home:  If your doctor says that you can, you may: ? Eat what you normally eat. ? Do your normal activities.  Take over-the-counter and prescription  medicines only as told by your doctor.  Keep all follow-up visits.  It is up to you to get the results of your test. Ask how to get your results when they are ready. Contact a doctor if:  You feel dizzy or light-headed.  You have a fast or irregular heartbeat.  You feel like you may vomit or you vomit.  You have a headache.  You feel short of breath. Get help right away if:  You develop pain or pressure: ? In your chest. ? In your jaw or neck. ? Between your shoulders. ? That goes down your left arm.  You faint.  You have trouble breathing. These symptoms may be   an emergency. Get medical help right away. Call your local emergency services (911 in the [place]  Do not wait to see if the symptoms will go away.  Do not drive yourself to the hospital. Summary  This is a test that checks how well your heart is working.  Follow instructions about what you cannot eat or drink before the test. Ask your doctor if you should take your normal medicines before the test.  Stop having caffeine 24 hours before the test.  Do not smoke or use products with nicotine or tobacco in them for 4 hours before the test.  During the test, your blood pressure and heart rhythm will be checked while you exercise. This information is not intended to replace advice given to you by your health care provider. Make sure you discuss any questions you have with your health care provider. Document Revised: 05/12/2020 Document Reviewed: 05/12/2020 Elsevier Patient Education  2021 [employer]   

## 2021-03-10 DIAGNOSIS — R079 Chest pain, unspecified: Secondary | ICD-10-CM

## 2021-03-24 ENCOUNTER — Telehealth: Payer: Self-pay

## 2021-03-24 NOTE — Telephone Encounter (Signed)
Left message for patient to return my call.

## 2021-03-24 NOTE — Telephone Encounter (Signed)
Spoke with patient's fiancee about patient's stress echo results. She verbalizes understanding. No further questions or concerns at this time.

## 2021-03-24 NOTE — Telephone Encounter (Signed)
Spoke with patient's fiancee, see chart.

## 2021-03-24 NOTE — Telephone Encounter (Signed)
   [facility] Clock and pt calling back, they said pt is incarcerated and provided authorization to [facility] to get pt's result, please call back [facility] cell # [phone]

## 2021-05-21 ENCOUNTER — Ambulatory Visit: Payer: Medicaid Other | Admitting: Cardiology

## 2021-05-24 ENCOUNTER — Encounter: Payer: Self-pay | Admitting: Cardiology

## 2022-02-14 ENCOUNTER — Other Ambulatory Visit: Payer: Self-pay | Admitting: Cardiology

## 2022-02-14 DIAGNOSIS — R079 Chest pain, unspecified: Secondary | ICD-10-CM

## 2023-10-07 ENCOUNTER — Emergency Department (HOSPITAL_COMMUNITY)
Admission: EM | Admit: 2023-10-07 | Discharge: 2023-10-07 | Discharge status: Against Medical Advice | Payer: Medicaid Other | Attending: Emergency Medicine | Admitting: Emergency Medicine

## 2023-10-07 ENCOUNTER — Other Ambulatory Visit: Payer: Self-pay

## 2023-10-07 ENCOUNTER — Encounter (HOSPITAL_COMMUNITY): Payer: Self-pay | Admitting: *Deleted

## 2023-10-07 ENCOUNTER — Emergency Department (HOSPITAL_COMMUNITY): Payer: Medicaid Other

## 2023-10-07 DIAGNOSIS — R509 Fever, unspecified: Secondary | ICD-10-CM | POA: Diagnosis present

## 2023-10-07 DIAGNOSIS — J101 Influenza due to other identified influenza virus with other respiratory manifestations: Secondary | ICD-10-CM | POA: Insufficient documentation

## 2023-10-07 DIAGNOSIS — Z20822 Contact with and (suspected) exposure to covid-19: Secondary | ICD-10-CM | POA: Insufficient documentation

## 2023-10-07 LAB — COMPREHENSIVE METABOLIC PANEL
ALT: 30 U/L (ref 0–44)
AST: 24 U/L (ref 15–41)
Albumin: 4.3 g/dL (ref 3.5–5.0)
Alkaline Phosphatase: 68 U/L (ref 38–126)
Anion gap: 8 (ref 5–15)
BUN: 9 mg/dL (ref 6–20)
CO2: 21 mmol/L — ABNORMAL LOW (ref 22–32)
Calcium: 9.3 mg/dL (ref 8.9–10.3)
Chloride: 105 mmol/L (ref 98–111)
Creatinine, Ser: 1.34 mg/dL — ABNORMAL HIGH (ref 0.61–1.24)
GFR, Estimated: >60 mL/min (ref >60)
Glucose, Bld: 123 mg/dL — ABNORMAL HIGH (ref 70–99)
Potassium: 3.8 mmol/L (ref 3.5–5.1)
Sodium: 134 mmol/L — ABNORMAL LOW (ref 135–145)
Total Bilirubin: 2.1 mg/dL — ABNORMAL HIGH (ref 0.0–1.2)
Total Protein: 7.9 g/dL (ref 6.5–8.1)

## 2023-10-07 LAB — CBC
HCT: 34.8 % — ABNORMAL LOW (ref 39.0–52.0)
Hemoglobin: 11.7 g/dL — ABNORMAL LOW (ref 13.0–17.0)
MCH: 22.5 pg — ABNORMAL LOW (ref 26.0–34.0)
MCHC: 33.6 g/dL (ref 30.0–36.0)
MCV: 67.1 fL — ABNORMAL LOW (ref 80.0–100.0)
Platelets: 141 10*3/uL — ABNORMAL LOW (ref 150–400)
RBC: 5.19 MIL/uL (ref 4.22–5.81)
RDW: 17.5 % — ABNORMAL HIGH (ref 11.5–15.5)
WBC: 8.8 10*3/uL (ref 4.0–10.5)
nRBC: 0 % (ref 0.0–0.2)

## 2023-10-07 LAB — RESP PANEL BY RT-PCR (RSV, FLU A&B, COVID)  RVPGX2
Influenza A by PCR: POSITIVE — AB
Influenza B by PCR: NEGATIVE
Resp Syncytial Virus by PCR: NEGATIVE
SARS Coronavirus 2 by RT PCR: NEGATIVE

## 2023-10-07 LAB — GROUP A STREP BY PCR: Group A Strep by PCR: NOT DETECTED

## 2023-10-07 MED ORDER — IBUPROFEN 400 MG PO TABS
400.0000 mg | ORAL_TABLET | Freq: Once | ORAL | Status: AC
  Administered 2023-10-07: 400 mg via ORAL
  Filled 2023-10-07: qty 1

## 2023-10-07 NOTE — ED Triage Notes (Signed)
 The pt has had a cough and a fever since yesterday  non-productive cough last tylenol was 0000

## 2023-10-07 NOTE — ED Notes (Signed)
 Pt seen leaving with son and wife.

## 2024-06-02 ENCOUNTER — Emergency Department (HOSPITAL_BASED_OUTPATIENT_CLINIC_OR_DEPARTMENT_OTHER)
Admission: EM | Admit: 2024-06-02 | Discharge: 2024-06-02 | Disposition: A | Discharge status: Home / Self Care | Attending: Emergency Medicine | Admitting: Emergency Medicine

## 2024-06-02 ENCOUNTER — Encounter (HOSPITAL_BASED_OUTPATIENT_CLINIC_OR_DEPARTMENT_OTHER): Payer: Self-pay

## 2024-06-02 ENCOUNTER — Other Ambulatory Visit: Payer: Self-pay

## 2024-06-02 DIAGNOSIS — M79652 Pain in left thigh: Secondary | ICD-10-CM | POA: Diagnosis present

## 2024-06-02 DIAGNOSIS — D5700 Hb-SS disease with crisis, unspecified: Secondary | ICD-10-CM | POA: Diagnosis not present

## 2024-06-02 LAB — CBC WITH DIFFERENTIAL/PLATELET
Abs Immature Granulocytes: 0.11 K/uL — ABNORMAL HIGH (ref 0.00–0.07)
Basophils Absolute: 0 K/uL (ref 0.0–0.1)
Basophils Relative: 0 %
Eosinophils Absolute: 0.2 K/uL (ref 0.0–0.5)
Eosinophils Relative: 3 %
HCT: 31.4 % — ABNORMAL LOW (ref 39.0–52.0)
Hemoglobin: 10.7 g/dL — ABNORMAL LOW (ref 13.0–17.0)
Immature Granulocytes: 2 %
Lymphocytes Relative: 25 %
Lymphs Abs: 1.9 K/uL (ref 0.7–4.0)
MCH: 23.3 pg — ABNORMAL LOW (ref 26.0–34.0)
MCHC: 34.1 g/dL (ref 30.0–36.0)
MCV: 68.3 fL — ABNORMAL LOW (ref 80.0–100.0)
Monocytes Absolute: 0.4 K/uL (ref 0.1–1.0)
Monocytes Relative: 6 %
Neutro Abs: 4.7 K/uL (ref 1.7–7.7)
Neutrophils Relative %: 64 %
Platelets: 139 K/uL — ABNORMAL LOW (ref 150–400)
RBC: 4.6 MIL/uL (ref 4.22–5.81)
RDW: 16.9 % — ABNORMAL HIGH (ref 11.5–15.5)
WBC: 7.3 K/uL (ref 4.0–10.5)
nRBC: 0 % (ref 0.0–0.2)

## 2024-06-02 LAB — COMPREHENSIVE METABOLIC PANEL WITH GFR
ALT: 26 U/L (ref 0–44)
AST: 20 U/L (ref 15–41)
Albumin: 4.3 g/dL (ref 3.5–5.0)
Alkaline Phosphatase: 83 U/L (ref 38–126)
Anion gap: 13 (ref 5–15)
BUN: 6 mg/dL (ref 6–20)
CO2: 23 mmol/L (ref 22–32)
Calcium: 9.3 mg/dL (ref 8.9–10.3)
Chloride: 104 mmol/L (ref 98–111)
Creatinine, Ser: 1.23 mg/dL (ref 0.61–1.24)
GFR, Estimated: >60 mL/min (ref >60)
Glucose, Bld: 151 mg/dL — ABNORMAL HIGH (ref 70–99)
Potassium: 3.6 mmol/L (ref 3.5–5.1)
Sodium: 140 mmol/L (ref 135–145)
Total Bilirubin: 1.1 mg/dL (ref 0.0–1.2)
Total Protein: 7.2 g/dL (ref 6.5–8.1)

## 2024-06-02 LAB — RETICULOCYTES
Immature Retic Fract: 29.7 % — ABNORMAL HIGH (ref 2.3–15.9)
RBC.: 4.75 MIL/uL (ref 4.22–5.81)
Retic Count, Absolute: 118.8 K/uL (ref 19.0–186.0)
Retic Ct Pct: 2.5 % (ref 0.4–3.1)

## 2024-06-02 MED ORDER — MORPHINE SULFATE (PF) 4 MG/ML IV SOLN: 8.0000 mg | INTRAVENOUS | Status: DC

## 2024-06-02 MED ORDER — MORPHINE SULFATE (PF) 4 MG/ML IV SOLN
10.0000 mg | INTRAVENOUS | Status: AC
  Administered 2024-06-02: 10 mg via INTRAVENOUS
  Filled 2024-06-02: qty 3

## 2024-06-02 MED ORDER — LACTATED RINGERS IV BOLUS
1000.0000 mL | Freq: Once | INTRAVENOUS | Status: AC
  Administered 2024-06-02: 1000 mL via INTRAVENOUS

## 2024-06-02 MED ORDER — DIPHENHYDRAMINE HCL 25 MG PO CAPS: 25.0000 mg | ORAL_CAPSULE | ORAL | Status: DC | PRN

## 2024-06-02 MED ORDER — MORPHINE SULFATE (PF) 10 MG/ML IV SOLN: 10.0000 mg | INTRAVENOUS | Status: DC

## 2024-06-02 MED ORDER — HYDROMORPHONE HCL 1 MG/ML IJ SOLN
1.0000 mg | Freq: Once | INTRAMUSCULAR | Status: DC
  Filled 2024-06-02: qty 1

## 2024-06-02 MED ORDER — MORPHINE SULFATE (PF) 4 MG/ML IV SOLN
8.0000 mg | INTRAVENOUS | Status: AC
  Administered 2024-06-02: 8 mg via INTRAVENOUS
  Filled 2024-06-02: qty 2

## 2024-06-02 MED ORDER — HYDROMORPHONE HCL 1 MG/ML IJ SOLN
1.0000 mg | Freq: Once | INTRAMUSCULAR | Status: AC
  Administered 2024-06-02: 1 mg via INTRAVENOUS

## 2024-06-02 NOTE — Discharge Instructions (Signed)
 You were treated here today for sickle cell pain. Your labs today were at your baseline.  Please reach out to your sickle cell clinic if you need further refills of your normal home oxycodone .  Please continue to keep well-hydrated with water at home.  Return to the ER for any worsening pain, chest pain, shortness of breath, leg swelling, any other new or concerning symptoms

## 2024-06-02 NOTE — ED Provider Notes (Signed)
 [facility] EMERGENCY DEPARTMENT AT [facility] Provider Note   CSN: [id number] Arrival date & time: 06/02/24  1113     Patient presents with: Sickle Cell Pain Crisis   Son Scott Hansen is a 38 y.o. male with history of sickle cell disease, presents with concern for pain to his thighs bilaterally that started yesterday.  He states this is consistent with his normal sickle cell pain as he usually develops pain in his legs and arms.  He reports he feels dehydrated and thinks this is why the pain has started.  He has taken 2 mg of home oxycodone  prior to arrival without significant relief in symptoms.  He denies any injuries to his legs.  Denies any leg swelling, recent long plane or car rides, recent surgeries or hospitalizations, and history of a blood clot.  Denies any chest pain or shortness of breath.    Sickle Cell Pain Crisis Associated symptoms: no chest pain and no fever        Prior to Admission medications   Medication Sig Start Date End Date Taking? Authorizing Provider  Cholecalciferol  (VITAMIN D -3) 125 MCG (5000 UT) TABS Take 5,000 Units by mouth daily. 08/28/19   Danton Lauraine LABOR, PA-C  FOLIC ACID PO Take 1 tablet by mouth daily. Strength unknown    [provider]  oxyCODONE -acetaminophen  (PERCOCET) 5-325 MG tablet Take 1 tablet by mouth every 4 (four) hours as needed for severe pain. 08/28/19   Danton Lauraine LABOR, PA-C  Vitamin D , Ergocalciferol , (DRISDOL ) 1.25 MG (50000 UT) CAPS capsule Take 1 capsule (50,000 Units total) by mouth every 7 (seven) days. 09/03/19   Danton Lauraine LABOR, PA-C    Allergies: Patient has no known allergies.    Review of Systems  Constitutional:  Negative for fever.  Cardiovascular:  Negative for chest pain and leg swelling.    Updated Vital Signs BP 110/82 (BP Location: Right Arm)   Pulse 65   Temp 97.9 F (36.6 C) (Oral)   Resp 18   Ht 6' 3 (1.905 m)   Wt (!) 140.6 kg   SpO2 99%   BMI 38.75 kg/m   Physical Exam Vitals  and nursing note reviewed.  Constitutional:      General: He is not in acute distress.    Appearance: Normal appearance. He is well-developed.  HENT:     Head: Normocephalic and atraumatic.  Eyes:     Conjunctiva/sclera: Conjunctivae normal.  Cardiovascular:     Rate and Rhythm: Normal rate and regular rhythm.     Heart sounds: No murmur heard.    Comments: 2+ pedal pulses bilaterally Pulmonary:     Effort: Pulmonary effort is normal. No respiratory distress.     Breath sounds: Normal breath sounds.  Abdominal:     Palpations: Abdomen is soft.     Tenderness: There is no abdominal tenderness.  Musculoskeletal:        General: No swelling.     Cervical back: Neck supple.     Right lower leg: No edema.     Left lower leg: No edema.     Comments: Bilateral lower extremity:  General No obvious deformity. No erythema, edema, contusions, open wounds   Palpation No calf tenderness to palpation.  Calves are soft Non tender over the femur, tibia, fibula, popliteal fossa  ROM Full hip and knee flexion and extension Full ankle plantarflexion dorsiflexion    Skin:    General: Skin is warm and dry.     Capillary Refill:  Capillary refill takes less than 2 seconds.  Neurological:     General: No focal deficit present.     Mental Status: He is alert.     Comments: Sensation intact throughout the bilateral lower extremities  Psychiatric:        Mood and Affect: Mood normal.        Behavior: Behavior normal.     (all labs ordered are listed, but only abnormal results are displayed) Labs Reviewed  COMPREHENSIVE METABOLIC PANEL WITH GFR - Abnormal; Notable for the following components:      Result Value   Glucose, Bld 151 (*)    All other components within normal limits  CBC WITH DIFFERENTIAL/PLATELET - Abnormal; Notable for the following components:   Hemoglobin 10.7 (*)    HCT 31.4 (*)    MCV 68.3 (*)    MCH 23.3 (*)    RDW 16.9 (*)    Platelets 139 (*)    Abs Immature  Granulocytes 0.11 (*)    All other components within normal limits  RETICULOCYTES - Abnormal; Notable for the following components:   Immature Retic Fract 29.7 (*)    All other components within normal limits    EKG: None  Radiology: No results found.   Procedures   Medications Ordered in the ED  diphenhydrAMINE  (BENADRYL ) capsule 25 mg (has no administration in time range)  HYDROmorphone  (DILAUDID ) injection 1 mg (1 mg Intravenous Given 06/02/24 1136)  morphine  (PF) 4 MG/ML injection 8 mg (8 mg Intravenous Given 06/02/24 1416)  lactated ringers  bolus 1,000 mL (0 mLs Intravenous Stopped 06/02/24 1425)  morphine  (PF) 4 MG/ML injection 10 mg (10 mg Intravenous Given 06/02/24 1316)                                    Medical Decision Making Amount and/or Complexity of Data Reviewed Labs: ordered.  Risk Prescription drug management.     Differential diagnosis includes but is not limited to DVT, fracture, dislocation, cellulitis, radiculopathy, muscle strain, sickle cell pain flare  ED Course:  Upon initial evaluation, patient is well-appearing, no acute distress.  Stable vitals.  Reporting pain to his thighs bilaterally.  He states this pain is consistent with previous sickle cell flares.  No overlying skin wounds, erythema, no concern for infectious etiology.  No involvement of the joints, no joint effusions, erythema, no concern for gout or septic arthritis.  He denies any injuries to the legs, no bony tenderness to palpation, low concern for fracture or dislocation.  His Well's score is 0, lower concern for DVT at this time.  No indication for D-dimer or ultrasound of the left lower extremities at this time.  He is nontender along the musculature of the left thigh, low concern for muscle strain. Will treat for sickle cell pain flare at this time. He states morphine  seems to treat his pain better than dilaudid , will treat with morphine .   Labs Ordered: I Ordered, and personally  interpreted labs.  The pertinent results include:   CBC with hemoglobin at 10.7, consistent with patient's baseline.  No leukocytosis CMP with elevated glucose at 151, otherwise within normal limits Reticulocytes with elevated immature reticulocyte at 29, consistent with baseline  Medications Given: Dilaudid  1mg  Morphine  8mg  Morphine  10mg  LR bolus  Upon re-evaluation, patient remains well-appearing with stable vitals.  Reports pain has almost completely resolved.  He feels he will be able to manage pain  at home.  He asked for a prescription for oxycodone .  I explained to patient that his refills need to come from the sickle cell clinic for doctor managing his sickle cell.  Patient stable and appropriate for discharge home.  Impression: Sickle cell pain   Disposition:  The patient was discharged home with instructions to take home medications as needed for pain.  Keep well-hydrated with water.  Follow-up with the sickle cell clinic as needed. Return precautions given.   This chart was dictated using voice recognition software, Dragon. Despite the best efforts of this provider to proofread and correct errors, errors may still occur which can change documentation meaning.       Final diagnoses:  Sickle-cell disease with pain [facility])    ED Discharge Orders     None          Veta Palma, PA-C 06/02/24 1537    Lenor Hollering, MD 06/02/24 2307

## 2024-06-02 NOTE — ED Triage Notes (Signed)
 Pt reports L leg/groin pain. Pt has hx of sickle cell. Pt concerned he is dehydrated.

## 2024-06-02 NOTE — ED Notes (Signed)
 Pt discharged home and given discharge paperwork. Opportunities given for questions. Pt verbalizes understanding. PIV removed x1. Bethena Powell SAUNDERS , RN
